# Patient Record
Sex: Female | Born: 1955 | Race: White | Hispanic: No | Marital: Single | Smoking: Current every day smoker
Health system: Southern US, Community
[De-identification: ages and names within clinical notes are randomized; demographics above are authoritative.]

---

## 1998-07-04 ENCOUNTER — Ambulatory Visit (HOSPITAL_COMMUNITY): Admission: RE | Admit: 1998-07-04 | Discharge: 1998-07-04 | Payer: Self-pay | Admitting: *Deleted

## 1999-09-02 ENCOUNTER — Other Ambulatory Visit: Admission: RE | Admit: 1999-09-02 | Discharge: 1999-09-02 | Payer: Self-pay | Admitting: *Deleted

## 2000-02-08 ENCOUNTER — Encounter: Payer: Self-pay | Admitting: Neurosurgery

## 2000-02-08 ENCOUNTER — Ambulatory Visit (HOSPITAL_COMMUNITY): Admission: RE | Admit: 2000-02-08 | Discharge: 2000-02-08 | Payer: Self-pay | Admitting: Neurosurgery

## 2000-06-25 ENCOUNTER — Other Ambulatory Visit: Admission: RE | Admit: 2000-06-25 | Discharge: 2000-06-25 | Payer: Self-pay | Admitting: *Deleted

## 2000-07-06 ENCOUNTER — Encounter: Payer: Self-pay | Admitting: General Surgery

## 2000-07-08 ENCOUNTER — Ambulatory Visit (HOSPITAL_COMMUNITY): Admission: RE | Admit: 2000-07-08 | Discharge: 2000-07-08 | Payer: Self-pay | Admitting: General Surgery

## 2000-07-08 ENCOUNTER — Encounter (INDEPENDENT_AMBULATORY_CARE_PROVIDER_SITE_OTHER): Payer: Self-pay | Admitting: *Deleted

## 2001-01-15 ENCOUNTER — Other Ambulatory Visit: Admission: RE | Admit: 2001-01-15 | Discharge: 2001-01-15 | Payer: Self-pay | Admitting: *Deleted

## 2002-08-26 ENCOUNTER — Other Ambulatory Visit: Admission: RE | Admit: 2002-08-26 | Discharge: 2002-08-26 | Payer: Self-pay | Admitting: *Deleted

## 2002-09-09 ENCOUNTER — Encounter: Payer: Self-pay | Admitting: *Deleted

## 2002-09-09 ENCOUNTER — Encounter: Admission: RE | Admit: 2002-09-09 | Discharge: 2002-09-09 | Payer: Self-pay | Admitting: *Deleted

## 2004-12-23 ENCOUNTER — Ambulatory Visit: Payer: Self-pay | Admitting: Psychiatry

## 2004-12-23 ENCOUNTER — Inpatient Hospital Stay (HOSPITAL_COMMUNITY): Admission: EM | Admit: 2004-12-23 | Discharge: 2004-12-27 | Payer: Self-pay | Admitting: Psychiatry

## 2005-12-20 ENCOUNTER — Emergency Department (HOSPITAL_COMMUNITY): Admission: EM | Admit: 2005-12-20 | Discharge: 2005-12-21 | Payer: Self-pay | Admitting: Emergency Medicine

## 2006-03-18 ENCOUNTER — Ambulatory Visit: Payer: Self-pay | Admitting: Family Medicine

## 2006-03-20 ENCOUNTER — Ambulatory Visit (HOSPITAL_COMMUNITY): Admission: RE | Admit: 2006-03-20 | Discharge: 2006-03-20 | Payer: Self-pay | Admitting: Family Medicine

## 2006-03-30 ENCOUNTER — Ambulatory Visit: Payer: Self-pay | Admitting: Nurse Practitioner

## 2006-03-31 ENCOUNTER — Ambulatory Visit: Payer: Self-pay | Admitting: *Deleted

## 2006-05-26 ENCOUNTER — Ambulatory Visit: Payer: Self-pay | Admitting: Family Medicine

## 2006-05-26 ENCOUNTER — Encounter (INDEPENDENT_AMBULATORY_CARE_PROVIDER_SITE_OTHER): Payer: Self-pay | Admitting: Family Medicine

## 2006-08-04 ENCOUNTER — Ambulatory Visit: Payer: Self-pay | Admitting: Family Medicine

## 2006-08-07 ENCOUNTER — Ambulatory Visit (HOSPITAL_COMMUNITY): Admission: RE | Admit: 2006-08-07 | Discharge: 2006-08-07 | Payer: Self-pay | Admitting: Family Medicine

## 2007-01-01 ENCOUNTER — Ambulatory Visit: Payer: Self-pay | Admitting: Family Medicine

## 2007-04-07 ENCOUNTER — Encounter (INDEPENDENT_AMBULATORY_CARE_PROVIDER_SITE_OTHER): Payer: Self-pay | Admitting: *Deleted

## 2007-05-31 ENCOUNTER — Ambulatory Visit: Payer: Self-pay | Admitting: Family Medicine

## 2007-05-31 LAB — CONVERTED CEMR LAB
Basophils Relative: 0 % (ref 0–1)
CO2: 26 meq/L (ref 19–32)
Chloride: 101 meq/L (ref 96–112)
Creatinine, Ser: 0.73 mg/dL (ref 0.40–1.20)
Eosinophils Relative: 1 % (ref 0–5)
Glucose, Bld: 99 mg/dL (ref 70–99)
Hemoglobin: 14.2 g/dL (ref 12.0–15.0)
INR: 1 (ref 0.0–1.5)
MCHC: 32.3 g/dL (ref 30.0–36.0)
MCV: 96.9 fL (ref 78.0–100.0)
Neutro Abs: 7.9 10*3/uL — ABNORMAL HIGH (ref 1.7–7.7)
Neutrophils Relative %: 72 % (ref 43–77)
Potassium: 4.9 meq/L (ref 3.5–5.3)
RDW: 13.4 % (ref 11.5–15.5)
Sodium: 139 meq/L (ref 135–145)
TSH: 1.911 microintl units/mL (ref 0.350–5.50)
Total Bilirubin: 0.4 mg/dL (ref 0.3–1.2)
WBC: 10.9 10*3/uL — ABNORMAL HIGH (ref 4.0–10.5)

## 2007-09-02 ENCOUNTER — Ambulatory Visit: Payer: Self-pay | Admitting: Family Medicine

## 2007-11-29 ENCOUNTER — Ambulatory Visit: Payer: Self-pay | Admitting: Internal Medicine

## 2007-12-07 ENCOUNTER — Ambulatory Visit: Payer: Self-pay | Admitting: Family Medicine

## 2008-03-16 ENCOUNTER — Ambulatory Visit: Payer: Self-pay | Admitting: Internal Medicine

## 2008-04-21 ENCOUNTER — Ambulatory Visit: Payer: Self-pay | Admitting: Family Medicine

## 2008-05-05 ENCOUNTER — Encounter (INDEPENDENT_AMBULATORY_CARE_PROVIDER_SITE_OTHER): Payer: Self-pay | Admitting: Family Medicine

## 2008-05-05 ENCOUNTER — Ambulatory Visit: Payer: Self-pay | Admitting: Internal Medicine

## 2008-05-05 LAB — CONVERTED CEMR LAB
ALT: 26 units/L (ref 0–35)
AST: 31 units/L (ref 0–37)
Albumin: 4.4 g/dL (ref 3.5–5.2)
BUN: 16 mg/dL (ref 6–23)
Basophils Absolute: 0 10*3/uL (ref 0.0–0.1)
Basophils Relative: 0 % (ref 0–1)
CO2: 26 meq/L (ref 19–32)
Creatinine, Ser: 0.83 mg/dL (ref 0.40–1.20)
Eosinophils Absolute: 0.1 10*3/uL (ref 0.0–0.7)
INR: 1 (ref 0.0–1.5)
MCHC: 34.4 g/dL (ref 30.0–36.0)
Monocytes Absolute: 1.3 10*3/uL — ABNORMAL HIGH (ref 0.1–1.0)
Neutro Abs: 7.3 10*3/uL (ref 1.7–7.7)
Potassium: 5.2 meq/L (ref 3.5–5.3)
Sodium: 140 meq/L (ref 135–145)
TSH: 2.2 microintl units/mL (ref 0.350–4.50)
Total Bilirubin: 0.3 mg/dL (ref 0.3–1.2)

## 2008-05-31 ENCOUNTER — Ambulatory Visit (HOSPITAL_COMMUNITY): Admission: RE | Admit: 2008-05-31 | Discharge: 2008-05-31 | Payer: Self-pay | Admitting: Family Medicine

## 2008-06-16 ENCOUNTER — Encounter: Admission: RE | Admit: 2008-06-16 | Discharge: 2008-06-16 | Payer: Self-pay | Admitting: Family Medicine

## 2008-06-23 ENCOUNTER — Emergency Department (HOSPITAL_COMMUNITY): Admission: EM | Admit: 2008-06-23 | Discharge: 2008-06-23 | Payer: Self-pay | Admitting: Family Medicine

## 2008-09-14 ENCOUNTER — Ambulatory Visit: Payer: Self-pay | Admitting: Gastroenterology

## 2008-10-02 ENCOUNTER — Ambulatory Visit: Payer: Self-pay | Admitting: Family Medicine

## 2008-10-05 ENCOUNTER — Ambulatory Visit: Payer: Self-pay | Admitting: Family Medicine

## 2008-10-06 ENCOUNTER — Encounter (INDEPENDENT_AMBULATORY_CARE_PROVIDER_SITE_OTHER): Payer: Self-pay | Admitting: Family Medicine

## 2008-10-06 ENCOUNTER — Inpatient Hospital Stay (HOSPITAL_COMMUNITY): Admission: EM | Admit: 2008-10-06 | Discharge: 2008-10-09 | Payer: Self-pay | Admitting: Emergency Medicine

## 2008-12-04 ENCOUNTER — Encounter (INDEPENDENT_AMBULATORY_CARE_PROVIDER_SITE_OTHER): Payer: Self-pay | Admitting: Interventional Radiology

## 2008-12-04 ENCOUNTER — Ambulatory Visit (HOSPITAL_COMMUNITY): Admission: RE | Admit: 2008-12-04 | Discharge: 2008-12-04 | Payer: Self-pay | Admitting: Gastroenterology

## 2008-12-07 ENCOUNTER — Ambulatory Visit: Payer: Self-pay | Admitting: Gastroenterology

## 2008-12-09 ENCOUNTER — Emergency Department (HOSPITAL_COMMUNITY): Admission: EM | Admit: 2008-12-09 | Discharge: 2008-12-09 | Payer: Self-pay | Admitting: Family Medicine

## 2009-02-01 ENCOUNTER — Ambulatory Visit: Payer: Self-pay | Admitting: Gastroenterology

## 2009-02-26 ENCOUNTER — Emergency Department (HOSPITAL_COMMUNITY): Admission: EM | Admit: 2009-02-26 | Discharge: 2009-02-26 | Payer: Self-pay | Admitting: Emergency Medicine

## 2009-03-22 ENCOUNTER — Emergency Department (HOSPITAL_COMMUNITY): Admission: EM | Admit: 2009-03-22 | Discharge: 2009-03-22 | Payer: Self-pay | Admitting: Family Medicine

## 2009-03-28 ENCOUNTER — Emergency Department (HOSPITAL_COMMUNITY): Admission: EM | Admit: 2009-03-28 | Discharge: 2009-03-28 | Payer: Self-pay | Admitting: Family Medicine

## 2009-06-01 ENCOUNTER — Ambulatory Visit (HOSPITAL_COMMUNITY): Admission: RE | Admit: 2009-06-01 | Discharge: 2009-06-01 | Payer: Self-pay | Admitting: Obstetrics

## 2009-08-16 ENCOUNTER — Ambulatory Visit: Payer: Self-pay | Admitting: Gastroenterology

## 2010-03-27 IMAGING — MG MM DIGITIAL SCREENING W/ IMPLANTS
6 series · 6 of 6 positions shown · non-contrast
Comparison: none

DG SCREENING W/IMPLANTS
Bilateral CC and MLO view(s) were taken.

DIGITAL SCREENING MAMMOGRAM W/IMPLANTS WITH CAD:
Right silicone implants present in a subglandular location.  Standard and modified compression 
views are obtained.
The breast tissue is extremely dense.  No dominant masses or malignant type calcifications are 
identified.  Compared with prior studies.

[R CC]
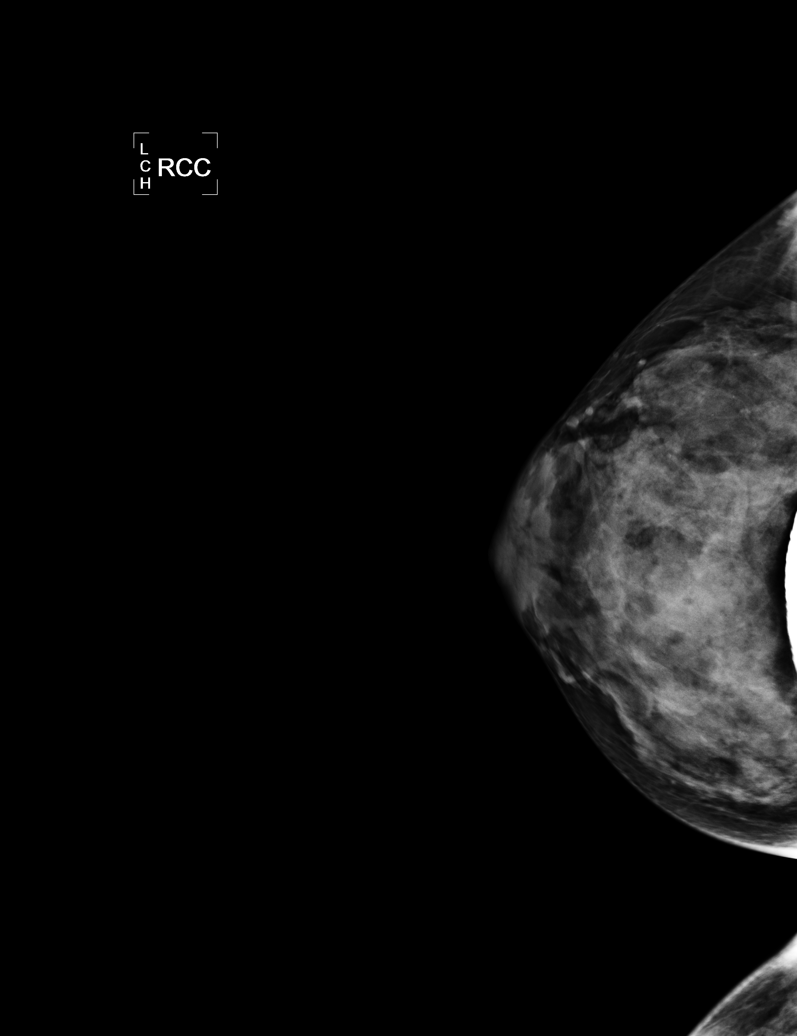

[R MLO]
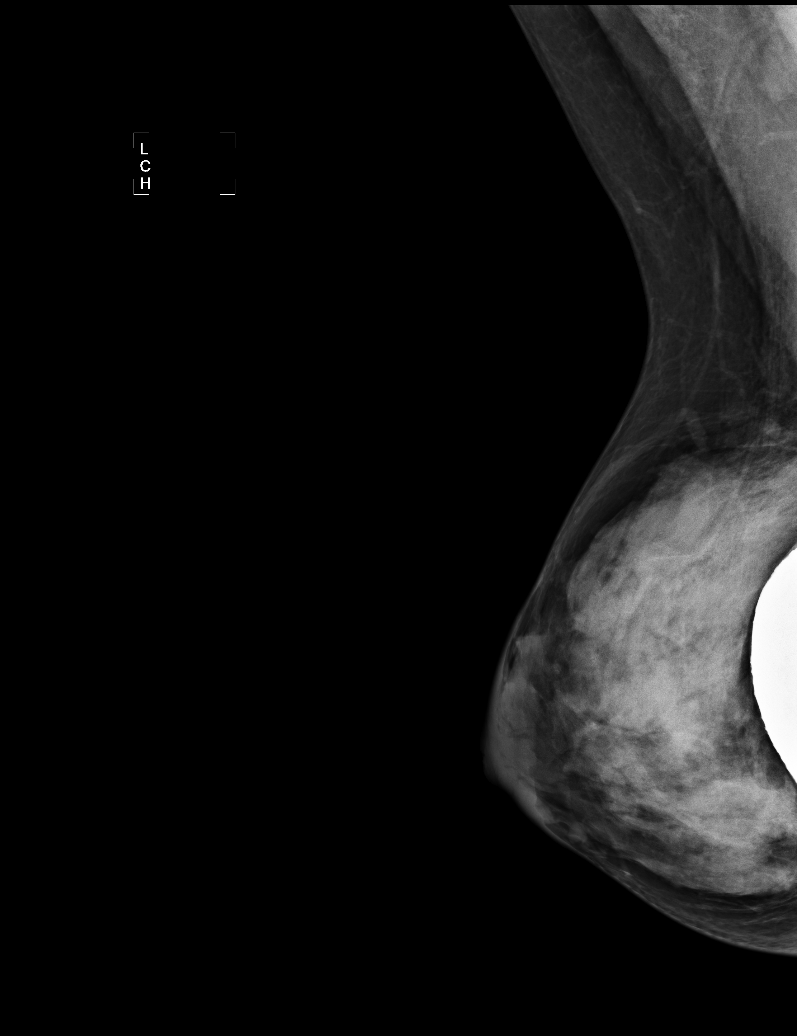

[L CC]
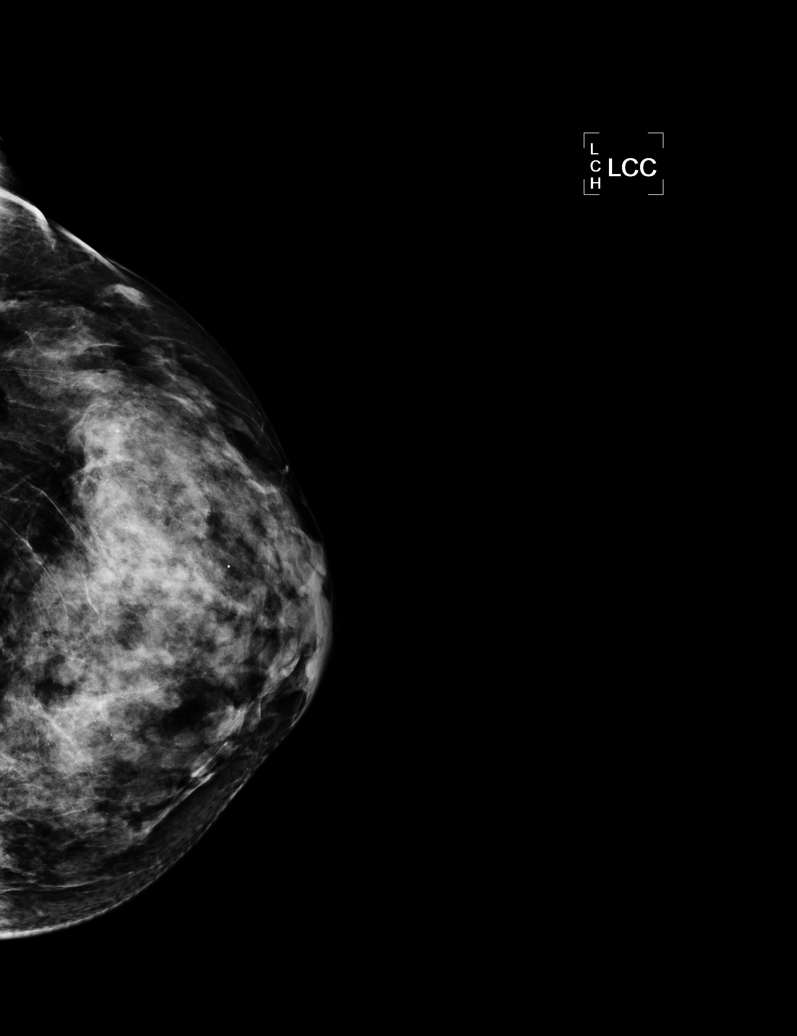

[L MLO]
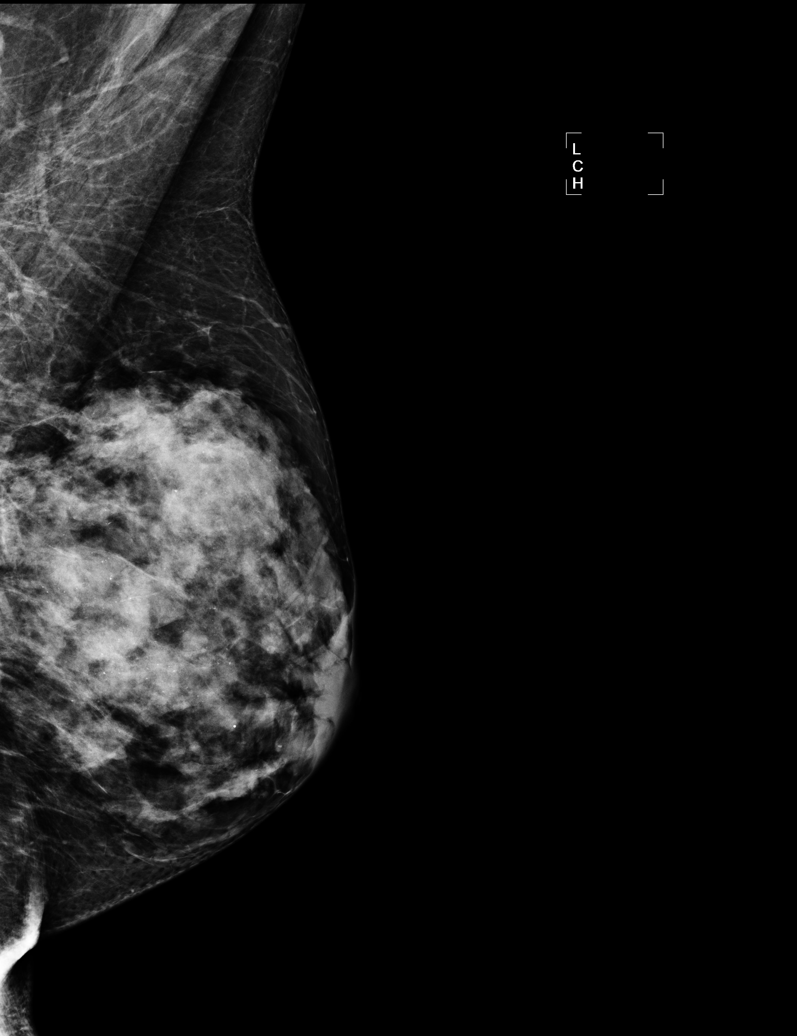

[R CCID]
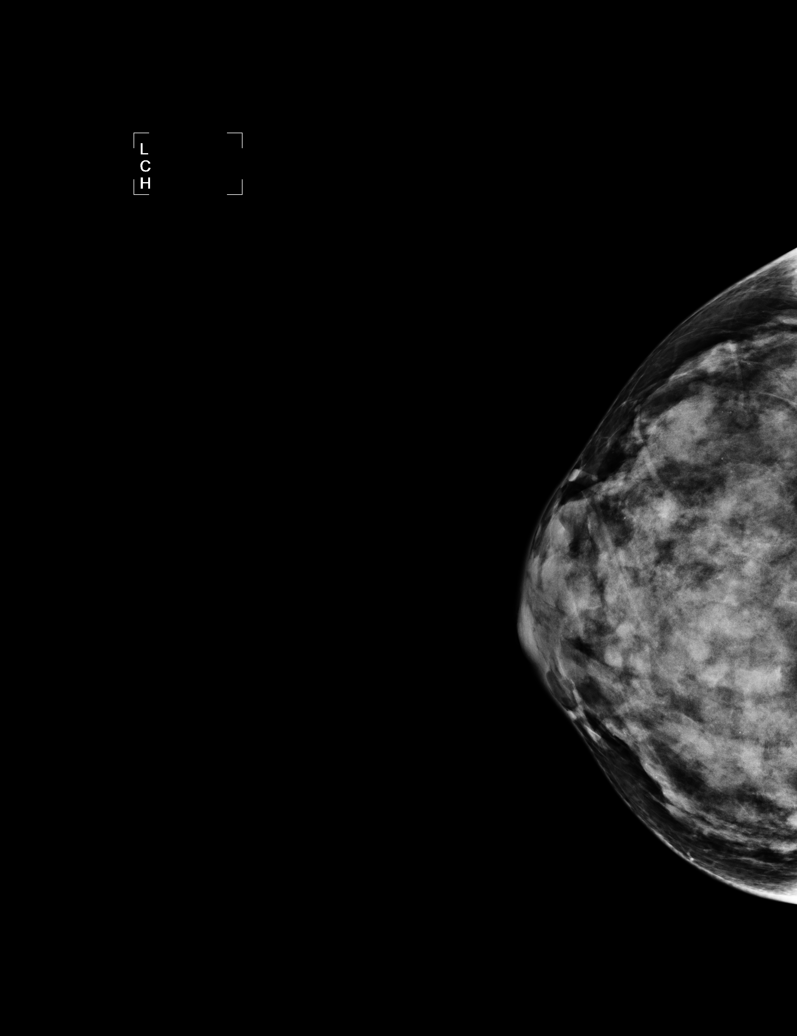

[R MLOID]
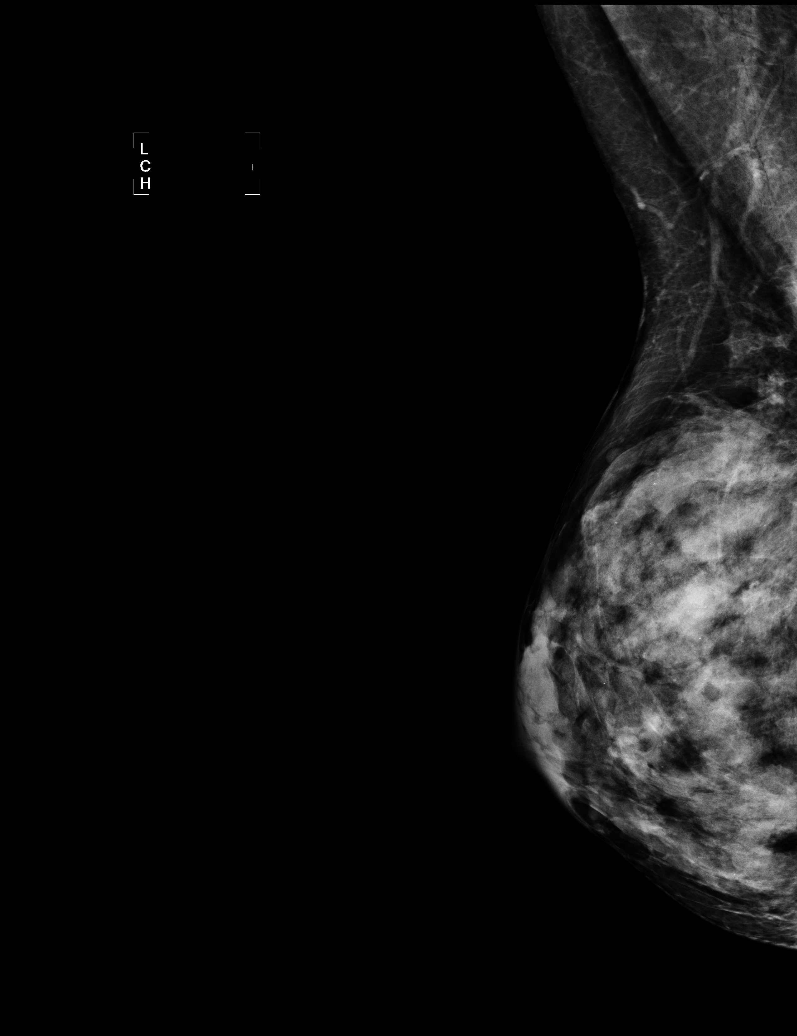

[6 of 6 positions shown; findings below may reference images not displayed]

IMPRESSION: No specific mammographic evidence of malignancy.  However, the patient reports left nipple 
inversion. Further evaluation is recommended. The patient will be contacted for additional imaging.

ASSESSMENT: Need additional imaging evaluation and/or prior mammograms for comparison - BI-RADS 0

Further imaging of the left breast.
ANALYZED BY COMPUTER AIDED DETECTION. , THIS PROCEDURE WAS A DIGITAL MAMMOGRAM.

## 2010-06-03 ENCOUNTER — Ambulatory Visit (HOSPITAL_COMMUNITY): Admission: RE | Admit: 2010-06-03 | Discharge: 2010-06-03 | Payer: Self-pay | Admitting: Specialist

## 2010-08-11 ENCOUNTER — Encounter: Payer: Self-pay | Admitting: Family Medicine

## 2010-10-25 LAB — WET PREP, GENITAL: Yeast Wet Prep HPF POC: NONE SEEN

## 2010-10-25 LAB — POCT URINALYSIS DIP (DEVICE)
Glucose, UA: NEGATIVE mg/dL
Ketones, ur: NEGATIVE mg/dL
Nitrite: NEGATIVE
Protein, ur: NEGATIVE mg/dL
Specific Gravity, Urine: 1.01 (ref 1.005–1.030)

## 2010-10-27 LAB — GC/CHLAMYDIA PROBE AMP, GENITAL: Chlamydia, DNA Probe: NEGATIVE

## 2010-10-29 LAB — CBC
HCT: 40.1 % (ref 36.0–46.0)
MCHC: 33.5 g/dL (ref 30.0–36.0)
MCV: 92.5 fL (ref 78.0–100.0)
RDW: 13.8 % (ref 11.5–15.5)
WBC: 6.7 10*3/uL (ref 4.0–10.5)

## 2010-10-29 LAB — WET PREP, GENITAL
Trich, Wet Prep: NONE SEEN
Yeast Wet Prep HPF POC: NONE SEEN

## 2010-10-29 LAB — GC/CHLAMYDIA PROBE AMP, GENITAL: Chlamydia, DNA Probe: NEGATIVE

## 2010-10-29 LAB — PROTIME-INR
INR: 1 (ref 0.00–1.49)
Prothrombin Time: 13.8 seconds (ref 11.6–15.2)

## 2010-10-31 LAB — COMPREHENSIVE METABOLIC PANEL
BUN: 8 mg/dL (ref 6–23)
Chloride: 106 mEq/L (ref 96–112)
Creatinine, Ser: 0.67 mg/dL (ref 0.4–1.2)
GFR calc Af Amer: >60 mL/min (ref >60)
GFR calc non Af Amer: >60 mL/min (ref >60)
Potassium: 4.1 mEq/L (ref 3.5–5.1)
Total Bilirubin: 0.6 mg/dL (ref 0.3–1.2)
Total Protein: 6.2 g/dL (ref 6.0–8.3)

## 2010-10-31 LAB — BASIC METABOLIC PANEL
BUN: 6 mg/dL (ref 6–23)
BUN: 7 mg/dL (ref 6–23)
CO2: 27 mEq/L (ref 19–32)
Calcium: 8.1 mg/dL — ABNORMAL LOW (ref 8.4–10.5)
Chloride: 107 mEq/L (ref 96–112)
Creatinine, Ser: 0.66 mg/dL (ref 0.4–1.2)
GFR calc non Af Amer: >60 mL/min (ref >60)
Glucose, Bld: 121 mg/dL — ABNORMAL HIGH (ref 70–99)
Glucose, Bld: 184 mg/dL — ABNORMAL HIGH (ref 70–99)
Potassium: 4 mEq/L (ref 3.5–5.1)
Sodium: 137 mEq/L (ref 135–145)
Sodium: 144 mEq/L (ref 135–145)

## 2010-10-31 LAB — URINALYSIS, ROUTINE W REFLEX MICROSCOPIC
Bilirubin Urine: NEGATIVE
Hgb urine dipstick: NEGATIVE
Ketones, ur: NEGATIVE mg/dL
Protein, ur: NEGATIVE mg/dL
Urobilinogen, UA: 1 mg/dL (ref 0.0–1.0)

## 2010-10-31 LAB — URINE CULTURE: Colony Count: NO GROWTH

## 2010-10-31 LAB — DIFFERENTIAL
Basophils Absolute: 0 10*3/uL (ref 0.0–0.1)
Basophils Absolute: 0 10*3/uL (ref 0.0–0.1)
Basophils Relative: 0 % (ref 0–1)
Basophils Relative: 0 % (ref 0–1)
Eosinophils Absolute: 0 10*3/uL (ref 0.0–0.7)
Lymphocytes Relative: 6 % — ABNORMAL LOW (ref 12–46)
Lymphs Abs: 1.6 10*3/uL (ref 0.7–4.0)
Lymphs Abs: 1.6 10*3/uL (ref 0.7–4.0)
Monocytes Absolute: 5.5 10*3/uL — ABNORMAL HIGH (ref 0.1–1.0)
Monocytes Relative: 13 % — ABNORMAL HIGH (ref 3–12)
Neutro Abs: 21 10*3/uL — ABNORMAL HIGH (ref 1.7–7.7)
Neutro Abs: 25.5 10*3/uL — ABNORMAL HIGH (ref 1.7–7.7)

## 2010-10-31 LAB — CBC
HCT: 34.3 % — ABNORMAL LOW (ref 36.0–46.0)
HCT: 34.7 % — ABNORMAL LOW (ref 36.0–46.0)
HCT: 37.4 % (ref 36.0–46.0)
Hemoglobin: 11.4 g/dL — ABNORMAL LOW (ref 12.0–15.0)
Hemoglobin: 11.7 g/dL — ABNORMAL LOW (ref 12.0–15.0)
MCHC: 33 g/dL (ref 30.0–36.0)
MCHC: 33.7 g/dL (ref 30.0–36.0)
MCV: 94.4 fL (ref 78.0–100.0)
MCV: 94.8 fL (ref 78.0–100.0)
MCV: 95.1 fL (ref 78.0–100.0)
Platelets: 298 10*3/uL (ref 150–400)
RBC: 3.64 MIL/uL — ABNORMAL LOW (ref 3.87–5.11)
RBC: 3.94 MIL/uL (ref 3.87–5.11)
RDW: 13.1 % (ref 11.5–15.5)
RDW: 13.2 % (ref 11.5–15.5)
WBC: 26 10*3/uL — ABNORMAL HIGH (ref 4.0–10.5)
WBC: 32.6 10*3/uL — ABNORMAL HIGH (ref 4.0–10.5)

## 2010-12-03 NOTE — Discharge Summary (Signed)
Jaime Luna, Jaime Luna              ACCOUNT NO.:  [id number]   MEDICAL RECORD NO.:  [id number]          PATIENT TYPE:  INP   LOCATION:  1342                         FACILITY:  [facility]   PHYSICIAN:  Hillery Aldo, M.D.   DATE OF BIRTH:  1956/04/04   DATE OF ADMISSION:  10/06/2008  DATE OF DISCHARGE:  10/09/2008                               DISCHARGE SUMMARY   PRIMARY CARE PHYSICIAN:  Dr. Audria Nine at [facility].   DISCHARGE DIAGNOSES:  1. Right-sided pyelonephritis.  2. Headache.  3. History of tobacco abuse.  4. History of depression.  5. History of hepatitis C.  6. Constipation.   DISCHARGE MEDICATIONS:  1. Cipro 500 mg b.i.d. x7 more days.  2. Prozac 20 mg p.o. daily.  3. Motrin 400 mg p.o. q.8 hours p.r.n. headache.   CONSULTATIONS:  None.   BRIEF ADMISSION HISTORY OF PRESENT ILLNESS:  The patient is a 55-year-  old female who presented to the emergency department for ongoing  complaints of dysuria, back pain and fever that started approximately 1  week prior to presentation.  She was prescribed outpatient Bactrim which  failed to relieve her symptoms and subsequently had 2 doses of Cipro  before representing to the hospital with complaints.  She was admitted  for further evaluation and treatment.  For the full details, please see  the dictated report done by Dr. Lovell Sheehan.   PROCEDURES AND DIAGNOSTIC STUDIES:  Renal ultrasound on October 07, 2008  showed no hydronephrosis.  Trace amount of right-sided perinephric  fluid, may be related to infection.   DISCHARGE LABORATORY VALUES:  Sodium was 144, potassium 3.5, chloride  111, bicarb 27, BUN 7, creatinine 0.66, glucose 121.  Urine cultures  were sterile.  White blood cell count was 15.2, hemoglobin 11.4,  hematocrit 34.7, platelets 342.   HOSPITAL COURSE BY PROBLEM:  1. Right-sided pyelonephritis:  The patient failed outpatient therapy      with Cipro and Septra but given the fact that her urine cultures      were  sterile, she likely was responding to treatments.  She has      completed 3 days of therapy with IV Cipro and she is now symptom      free.  We will discharge her on an additional 7 days of therapy      with Cipro at 500 mg.  2. Headache:  The patient's headache was relieved by Motrin.  3. Tobacco abuse:  The patient was counseled on smoking cessation and      provided with nicotine patch while in the hospital.  4. History of depression:  The patient's mood has been stable.  She      claims that she is not taking Prozac any longer but wishes to      restart this medication.  A prescription has been written for 20 mg      Prozac to take daily.  5. Hepatitis C:  The patient had no active issues and should follow up      with her primary care physician for consideration of initiating      therapy.  6. Constipation:  The patient was provided with laxative of choice      prior to discharge.   DISPOSITION:  The patient is medically stable and will be discharged  home.  She is instructed to follow up with her primary care physician in  1 - 2 weeks.   Time spent coordinating care for discharge and discharge instructions  equals 35 minutes.      Hillery Aldo, M.D.  Electronically Signed     CR/MEDQ  D:  10/09/2008  T:  10/09/2008  Job:  [id number]   cc:   Maurice March, M.D.  Fax: [phone]

## 2010-12-03 NOTE — H&P (Signed)
NAMEANTOINETTE, BORGWARDT              ACCOUNT NO.:  [id number]   MEDICAL RECORD NO.:  [id number]          PATIENT TYPE:  INP   LOCATION:  1342                         FACILITY:  [facility]   PHYSICIAN:  Della Goo, M.D. DATE OF BIRTH:  07/16/1956   DATE OF ADMISSION:  10/06/2008  DATE OF DISCHARGE:                              HISTORY & PHYSICAL   CHIEF COMPLAINTS:  Back pain, fever, chills and painful urination.   HISTORY OF PRESENT ILLNESS:  This is a 55 year old female who presented  to the emergency department with complaints of continued dysuria, back  pain, fevers and she has had symptoms for over 1 week.  She reports  having antibiotic therapy prescribed by her primary care physician of  Bactrim, but had no relief of her symptoms.  She notified her PCP and  was placed on Cipro therapy 1 day ago and received an injection of Cipro  x1.  She presented to the emergency department secondary to continued  symptoms and worsening right-sided back pain as well.  She also reports  having nausea and vomiting.   PAST MEDICAL HISTORY:  1. Depression.  2. History of previous substance abuse.   PAST SURGICAL HISTORY:  1. History of a left ovarian cyst which was drained.  2. D and C.  3. Right breast implant.  4. Tonsillectomy and adenoidectomy.   MEDICATIONS:  At this time, include Prozac.   ALLERGIES:  NO KNOWN DRUG ALLERGIES.   SOCIAL HISTORY:  The patient is a smoker, she reports cutting down.  She  has smoked a total of 35 years and is now down to 4 cigarettes daily.  She denies any alcohol usage and denies any recent drug illicit drug  usage.   FAMILY HISTORY:  Noncontributory.   REVIEW OF SYSTEMS:  Pertinents are mentioned above.   PHYSICAL EXAMINATION:  GENERAL:  This is a 55 year old thin, well-  developed female in discomfort but no acute distress.  VITAL SIGNS:  Temperature 98.9, blood pressure 99/47, heart rate 86, respirations 20.  O2 saturations 99% on room air.  HEENT:  Normocephalic, atraumatic.  Pupils equally round and reactive to  light.  Extraocular movements are intact.  Funduscopic benign.  Oropharynx is clear.  NECK:  Supple.  Full range of motion.  No thyromegaly, adenopathy or  jugulovenous distention.  CARDIOVASCULAR:  Regular rate and rhythm.  No  murmurs, gallops or rubs.  LUNGS:  Clear to auscultation bilaterally.  No rales, rhonchi or wheezes  appreciated.  ABDOMEN:  Positive bowel sounds.  Soft, mildly tender in the suprapubic  area and right-sided costovertebral angle tenderness.  Otherwise  nondistended.  No hepatosplenomegaly.  No rebound or guarding.  EXTREMITIES:  Without cyanosis, clubbing or edema.  NEUROLOGIC:  The patient is a the patient is alert and oriented x3.  There are no focal deficits.   LABORATORY STUDIES:  White blood cell count 32.6, hemoglobin 12.6,  hematocrit 37.4, platelets 335, neutrophils 78%, lymphocytes 5%, MCV  94.8.  Sodium 135, potassium 4.1, chloride 106, carbon dioxide 22, BUN  8, creatinine 0.67 and glucose 115.  Urinalysis reveals small leukocyte  esterase.  Urine microscopic reveals 11-20 urine white blood cells per  high-power field and rare bacteria.   ASSESSMENT:  A 55 year old female being admitted with;  1. Urinary tract infection/early urosepsis.  2. Abdominal pain.  3. Nausea and vomiting.   PLAN:  The patient will be admitted and placed on IV antibiotic therapy.  She has already received Rocephin 2 grams IV x1.  Rocephin therapy will  be continued and this antibiotic therapy will be changed pending her  condition and results of the urine culture.  IV fluids have been ordered  for fluid resuscitation.  The patient will be placed on pain medications  as her blood pressure tolerates and antipyretics as needed.  DVT and GI  prophylaxis have also been ordered.      Della Goo, M.D.  Electronically Signed     HJ/MEDQ  D:  10/06/2008  T:  10/07/2008  Job:  [id number]

## 2010-12-06 NOTE — H&P (Signed)
Jaime Luna, Jaime Luna              ACCOUNT NO.:  [id number]   MEDICAL RECORD NO.:  [id number]          PATIENT TYPE:  IPS   LOCATION:  0599                          [facility]ILITY[facility]  [facility]   PHYSICIAN:  Jeanice Lim, M.D. DATE OF BIRTH:  1956-02-12   DATE OF ADMISSION:  12/23/2004  DATE OF DISCHARGE:                         PSYCHIATRIC ADMISSION ASSESSMENT   IDENTIFYING INFORMATION:  A 55 year old single white female, voluntarily  admitted on December 23, 2004.   HISTORY OF PRESENT ILLNESS:  The patient presents with a history of  substance abuse, has been drinking beer, wine and liquor.  Last drink was on  December 22, 2004.  Can drink up to a 12 cans at a time.  The patient also has  been using crack cocaine as well for the past 10 years, using it on and off.  Longest history of sobriety has been one year while she was pregnant.  She  is tired of being sick and tired. The patient wants to be with her family  again and be there for her son.  She feels depressed with suicidal thoughts  but her deterrent is her son.  She reports no physical problems with her  substance abuse.  Her sleep and eating has been satisfactory.  She denies  any psychotic symptoms.   PAST PSYCHIATRIC HISTORY:  First admission to [facility], no  history of being detoxed prior.  She has a history of suicidal thoughts, has  no suicide attempts.   SOCIAL HISTORY:  She is a 52 year old single white female with a 52 year old  child.  Her child is currently with the father of that child.  She is  homeless.  She is not working at this time.  She had a court date pending  today for drug paraphernalia.   FAMILY HISTORY:  Son who has ADHD.  Her sister who has problems with alcohol  who has been sober for years.   ALCOHOL DRUG HISTORY:  The patient smokes, reports drinking in the morning,  with blackouts.  Denies any seizure activity or DT's.   PAST MEDICAL HISTORY:  Primary care Mackie Holness is none.  Medical problems  are  none.   MEDICATIONS:  In the past, the patient has been on Wellbutrin, Paxil but  feels she did not stay with the medicine long enough to know if they worked.   DRUG ALLERGIES:  No known allergies.   PHYSICAL EXAMINATION:  The patient was assessed at [facility].  This is a  tired-appearing middle-aged female in no acute distress.  No tremors are  noted.  Vital signs are stable, 97.7, 68 heart rate, 20 respirations.  Blood  pressure is 113/68.  Urine drug screen is positive for cocaine.  Alcohol  level was 123.  BMET was within normal limits.   MENTAL STATUS EXAM:  She is a fully alert, cooperative female, fair eye  contact, casually dressed.  Speech is clear, normal pace and tone.  The  patient feels depressed.  Affect is sad, tired appearing.  Thought processes  are coherent, no evidence of psychosis. Cognitive function intact.  Judgment  is  poor, insight is minimal, poor impulse control.   ADMISSION DIAGNOSES:  AXIS I:  Alcohol depression.  Cocaine abuse.  Rule out  bipolar disorder.  AXIS II:  Deferred.  AXIS III:  None.  AXIS IV:  Problems with primary support group, occupation, housing, problems  related to legal system and crime, other psychosocial problems.  AXIS V:  Current is 35, estimated this past year 32.   PLAN:  Detox patient, work on relapse prevention.  We will offer fluids.  The patient is to attend all  individual and group therapy.  We will  consider an antidepressant as the patient feels they may have been of some  benefit if she would have stayed with them consistently.  Case manager to  look at housing and rehab programs available to patient.  Consider a family  session with her support group which seems to be her sister.   TENTATIVE LENGTH OF CARE:  5-6 days.       JO/MEDQ  D:  12/23/2004  T:  12/23/2004  Job:  [id number]

## 2010-12-06 NOTE — Discharge Summary (Signed)
Jaime Luna, [name]              ACCOUNT NO.:  [id number]   MEDICAL RECORD NO.:  [id number]          PATIENT TYPE:  IPS   LOCATION:  0507                          [facility]ILITY[facility]  [facility]   PHYSICIAN:  Jeanice Lim, M.D. DATE OF BIRTH:  23-Oct-1955   DATE OF ADMISSION:  12/23/2004  DATE OF DISCHARGE:  12/27/2004                                 DISCHARGE SUMMARY   IDENTIFYING DATA:  This is a 55 year old single Caucasian female voluntarily  admitted.  Had a history of substance abuse, drinking beer, wine and liquor.  Last drink was on June 4th, using crack cocaine for 10 years off and on.  The longest history of sobriety was one year while pregnant.  Was tired of  being sick and tired.  Wanted to be a family again.  Felt depressed with  suicidal ideation.   MEDICATIONS:  Wellbutrin and Paxil in the past.  Uncertain if effective.   ALLERGIES:  No known drug allergies.   PHYSICAL EXAMINATION:  Physical and neurologic exam within normal limits.   LABORATORY DATA:  Routine admission labs within normal limits.   MENTAL STATUS EXAM:  Fully alert, cooperative.  Fair eye contact.  Speech  clear.  Mood depressed.  Affect restricted, sad, appropriate to content.  Thought processes goal directed.  No psychotic symptoms.  Again, positive  suicidal ideation with possible plan, somewhat fleeting and passive mostly.  Cognitively intact.  Judgment and insight were poor.   ADMISSION DIAGNOSES:   AXIS I:  1.  Alcohol dependence.  2.  Cocaine dependence.  3.  Possible major depression, single episode, versus major depression,      moderate, recurrent.  4.  Rule out bipolar disorder, type 2.  5.  Substance-induced mood disorder.   AXIS II:  Deferred.   AXIS III:  None.   AXIS IV:  Moderate (problems with primary support group, occupation, housing  and other problems related to legal system and psychosocial issues).   AXIS V:  35/60.   HOSPITAL COURSE:  The patient was admitted and ordered  routine p.r.n.  medications and underwent further monitoring.  Was encouraged to participate  in individual, group and milieu therapy.  The patient was monitored for  detox.  Worked on relapse prevention plan.  Was optimized on medications.  Was worked up for STDs and participated in dual-diagnosis substance abuse  treatment.  Developed a relapse prevention plan.  Reported a decrease in  depressive symptoms.  Less tearful.  Sleep and appetite improved and worked  on obtaining an [employer] and identifying triggers.   CONDITION ON DISCHARGE:  Eventually discharged in improved condition with a  relapse prevention plan in place, showing improved insight and judgment and  coping skills with no acute withdrawal symptoms, no dangerous ideation.  She, again, was given medication education and risk/benefit ratio and  alternative treatments were reviewed regarding medications.   DISCHARGE MEDICATIONS:  1.  Amantadine 100 mg b.i.d. p.r.n. craving.  2.  Wellbutrin XL 150 mg daily.  3.  Celexa 20 mg, 1/2 daily.  4.  Trazodone 50 mg, 1 q.h.s.   FOLLOW  UP:  The patient was to follow up with [facility] on June 15 at  11:30 with Dr. Lang Snow and [facility] walk-in assessment, Monday through Friday, 1  p.m. through 4 p.m.  The patient, again, was discharged in improved  condition with no dangerous ideation.   DISCHARGE DIAGNOSES:   AXIS I:  1.  Alcohol dependence.  2.  Cocaine dependence.  3.  Possible major depression, single episode, versus major depression,      moderate, recurrent.  4.  Rule out bipolar disorder, type 2.  5.  Substance-induced mood disorder.   AXIS II:  Deferred.   AXIS III:  None.   AXIS IV:  Moderate (problems with primary support group, occupation, housing  and other problems related to legal system and psychosocial issues).   AXIS V:  Global Assessment of Functioning on discharge 55.       JEM/MEDQ  D:  01/27/2005  T:  01/27/2005  Job:  [id number]

## 2011-02-28 ENCOUNTER — Other Ambulatory Visit: Payer: Self-pay

## 2011-03-03 ENCOUNTER — Other Ambulatory Visit (HOSPITAL_COMMUNITY): Payer: Self-pay | Admitting: Family Medicine

## 2011-03-03 DIAGNOSIS — R634 Abnormal weight loss: Secondary | ICD-10-CM

## 2011-03-03 DIAGNOSIS — N912 Amenorrhea, unspecified: Secondary | ICD-10-CM

## 2011-03-07 ENCOUNTER — Ambulatory Visit (HOSPITAL_COMMUNITY): Payer: Self-pay

## 2011-03-10 ENCOUNTER — Other Ambulatory Visit (HOSPITAL_COMMUNITY): Payer: Self-pay | Admitting: Family Medicine

## 2011-03-10 ENCOUNTER — Ambulatory Visit (HOSPITAL_COMMUNITY)
Admission: RE | Admit: 2011-03-10 | Discharge: 2011-03-10 | Disposition: A | Discharge status: Home / Self Care | Payer: BC Managed Care – PPO | Source: Ambulatory Visit | Attending: Family Medicine | Admitting: Family Medicine

## 2011-03-10 DIAGNOSIS — N912 Amenorrhea, unspecified: Secondary | ICD-10-CM

## 2011-03-10 DIAGNOSIS — Z780 Asymptomatic menopausal state: Secondary | ICD-10-CM | POA: Insufficient documentation

## 2011-03-10 DIAGNOSIS — Z13820 Encounter for screening for osteoporosis: Secondary | ICD-10-CM | POA: Insufficient documentation

## 2011-03-10 DIAGNOSIS — R634 Abnormal weight loss: Secondary | ICD-10-CM

## 2011-03-10 DIAGNOSIS — Z8262 Family history of osteoporosis: Secondary | ICD-10-CM | POA: Insufficient documentation

## 2011-08-12 ENCOUNTER — Other Ambulatory Visit (HOSPITAL_COMMUNITY): Payer: Self-pay | Admitting: Family Medicine

## 2011-08-12 DIAGNOSIS — Z1231 Encounter for screening mammogram for malignant neoplasm of breast: Secondary | ICD-10-CM

## 2011-09-09 ENCOUNTER — Ambulatory Visit (HOSPITAL_COMMUNITY): Payer: BC Managed Care – PPO

## 2011-10-06 ENCOUNTER — Ambulatory Visit (HOSPITAL_COMMUNITY)
Admission: RE | Admit: 2011-10-06 | Discharge: 2011-10-06 | Disposition: A | Discharge status: Home / Self Care | Payer: BC Managed Care – PPO | Source: Ambulatory Visit | Attending: Family Medicine | Admitting: Family Medicine

## 2011-10-06 DIAGNOSIS — Z1231 Encounter for screening mammogram for malignant neoplasm of breast: Secondary | ICD-10-CM | POA: Insufficient documentation

## 2012-02-12 ENCOUNTER — Ambulatory Visit: Payer: BC Managed Care – PPO | Admitting: Gastroenterology

## 2014-09-03 ENCOUNTER — Emergency Department (HOSPITAL_COMMUNITY): Payer: Self-pay

## 2014-09-03 ENCOUNTER — Emergency Department (HOSPITAL_COMMUNITY)
Admission: EM | Admit: 2014-09-03 | Discharge: 2014-09-03 | Disposition: A | Discharge status: Home / Self Care | Payer: Self-pay | Attending: Emergency Medicine | Admitting: Emergency Medicine

## 2014-09-03 ENCOUNTER — Encounter (HOSPITAL_COMMUNITY): Payer: Self-pay | Admitting: Physical Medicine and Rehabilitation

## 2014-09-03 DIAGNOSIS — Y9289 Other specified places as the place of occurrence of the external cause: Secondary | ICD-10-CM | POA: Insufficient documentation

## 2014-09-03 DIAGNOSIS — S0990XA Unspecified injury of head, initial encounter: Secondary | ICD-10-CM | POA: Insufficient documentation

## 2014-09-03 DIAGNOSIS — Z720 Tobacco use: Secondary | ICD-10-CM | POA: Insufficient documentation

## 2014-09-03 DIAGNOSIS — R52 Pain, unspecified: Secondary | ICD-10-CM

## 2014-09-03 DIAGNOSIS — Y998 Other external cause status: Secondary | ICD-10-CM | POA: Insufficient documentation

## 2014-09-03 DIAGNOSIS — R0602 Shortness of breath: Secondary | ICD-10-CM | POA: Insufficient documentation

## 2014-09-03 DIAGNOSIS — Y9389 Activity, other specified: Secondary | ICD-10-CM | POA: Insufficient documentation

## 2014-09-03 DIAGNOSIS — R40241 Glasgow coma scale score 13-15: Secondary | ICD-10-CM | POA: Insufficient documentation

## 2014-09-03 DIAGNOSIS — S29001A Unspecified injury of muscle and tendon of front wall of thorax, initial encounter: Secondary | ICD-10-CM | POA: Insufficient documentation

## 2014-09-03 MED ORDER — OXYCODONE-ACETAMINOPHEN 5-325 MG PO TABS
2.0000 | ORAL_TABLET | Freq: Once | ORAL | Status: AC
  Administered 2014-09-03: 2 via ORAL
  Filled 2014-09-03: qty 2

## 2014-09-03 MED ORDER — OXYCODONE-ACETAMINOPHEN 5-325 MG PO TABS: 1.0000 | ORAL_TABLET | Freq: Four times a day (QID) | ORAL | Status: AC | PRN

## 2014-09-03 MED ORDER — OXYCODONE HCL 5 MG PO TABS: 5.0000 mg | ORAL_TABLET | Freq: Four times a day (QID) | ORAL | Status: AC | PRN
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
## 2014-09-08 ENCOUNTER — Emergency Department: Payer: Self-pay | Admitting: Emergency Medicine

## 2020-07-24 ENCOUNTER — Emergency Department: Payer: BLUE CROSS/BLUE SHIELD

## 2020-07-24 ENCOUNTER — Encounter: Payer: Self-pay | Admitting: *Deleted

## 2020-07-24 ENCOUNTER — Other Ambulatory Visit: Payer: Self-pay

## 2020-07-24 DIAGNOSIS — Z5321 Procedure and treatment not carried out due to patient leaving prior to being seen by health care provider: Secondary | ICD-10-CM | POA: Diagnosis not present

## 2020-07-24 DIAGNOSIS — R079 Chest pain, unspecified: Secondary | ICD-10-CM | POA: Insufficient documentation

## 2020-07-24 DIAGNOSIS — R202 Paresthesia of skin: Secondary | ICD-10-CM | POA: Insufficient documentation

## 2020-07-24 LAB — BASIC METABOLIC PANEL
Anion gap: 9 (ref 5–15)
BUN: 15 mg/dL (ref 8–23)
CO2: 30 mmol/L (ref 22–32)
Calcium: 8.6 mg/dL — ABNORMAL LOW (ref 8.9–10.3)
Chloride: 102 mmol/L (ref 98–111)
Creatinine, Ser: 0.63 mg/dL (ref 0.44–1.00)
GFR, Estimated: >60 mL/min (ref >60)
Glucose, Bld: 98 mg/dL (ref 70–99)
Potassium: 4.3 mmol/L (ref 3.5–5.1)
Sodium: 141 mmol/L (ref 135–145)

## 2020-07-24 LAB — CBC
HCT: 40.5 % (ref 36.0–46.0)
Hemoglobin: 13.6 g/dL (ref 12.0–15.0)
MCH: 31.8 pg (ref 26.0–34.0)
MCHC: 33.6 g/dL (ref 30.0–36.0)
MCV: 94.6 fL (ref 80.0–100.0)
Platelets: 283 10*3/uL (ref 150–400)
RBC: 4.28 MIL/uL (ref 3.87–5.11)
RDW: 12.1 % (ref 11.5–15.5)
WBC: 7.3 10*3/uL (ref 4.0–10.5)
nRBC: 0 % (ref 0.0–0.2)

## 2020-07-24 LAB — TROPONIN I (HIGH SENSITIVITY)
Troponin I (High Sensitivity): 6 ng/L (ref <18)
Troponin I (High Sensitivity): 5 ng/L (ref <18)

## 2020-07-24 NOTE — ED Triage Notes (Signed)
Pt ambulatory to triage.  Pt has left side chest pain with pain and numbness in left arm  Pain began tonight While lying in bed.   No n/v/d  Pt alert  Speech clear

## 2020-07-25 ENCOUNTER — Emergency Department
Admission: EM | Admit: 2020-07-25 | Discharge: 2020-07-25 | Disposition: A | Discharge status: Home / Self Care | Payer: BLUE CROSS/BLUE SHIELD | Attending: Emergency Medicine | Admitting: Emergency Medicine
